# Patient Record
Sex: Female | Born: 1965 | Race: Black or African American | Hispanic: No | Marital: Married | Smoking: Never smoker
Health system: Southern US, Community
[De-identification: ages and names within clinical notes are randomized; demographics above are authoritative.]

## PROBLEM LIST (undated history)

## (undated) DIAGNOSIS — N189 Chronic kidney disease, unspecified: Secondary | ICD-10-CM

## (undated) DIAGNOSIS — D649 Anemia, unspecified: Secondary | ICD-10-CM

## (undated) DIAGNOSIS — N6001 Solitary cyst of right breast: Secondary | ICD-10-CM

## (undated) HISTORY — PX: TUBAL LIGATION: SHX77

## (undated) HISTORY — PX: CRYOTHERAPY: SHX1416

## (undated) HISTORY — DX: Solitary cyst of right breast: N60.01

---
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
## 2000-04-05 HISTORY — PX: BREAST BIOPSY: SHX20

## 2002-04-05 HISTORY — PX: BREAST MASS EXCISION: SHX1267

## 2005-09-10 ENCOUNTER — Emergency Department: Payer: Self-pay | Admitting: General Practice

## 2008-02-16 ENCOUNTER — Inpatient Hospital Stay: Payer: Self-pay

## 2010-09-01 ENCOUNTER — Ambulatory Visit: Payer: Self-pay

## 2013-12-06 ENCOUNTER — Encounter: Payer: Self-pay | Admitting: *Deleted

## 2013-12-19 ENCOUNTER — Encounter: Payer: Self-pay | Admitting: General Surgery

## 2013-12-19 ENCOUNTER — Ambulatory Visit (INDEPENDENT_AMBULATORY_CARE_PROVIDER_SITE_OTHER): Payer: Managed Care, Other (non HMO) | Admitting: General Surgery

## 2013-12-19 ENCOUNTER — Other Ambulatory Visit: Payer: Managed Care, Other (non HMO)

## 2013-12-19 VITALS — BP 130/88 | HR 86 | Resp 14 | Ht 66.0 in | Wt 184.0 lb

## 2013-12-19 DIAGNOSIS — N6320 Unspecified lump in the left breast, unspecified quadrant: Secondary | ICD-10-CM

## 2013-12-19 DIAGNOSIS — N63 Unspecified lump in unspecified breast: Secondary | ICD-10-CM

## 2013-12-19 NOTE — Progress Notes (Signed)
Patient ID: Belinda Gill, female   DOB: March 28, 1966, 48 y.o.   MRN: [id number]  Chief Complaint  Patient presents with  . Breast Problem    lump left breast    HPI Belinda Gill is a 48 y.o. female here today for a breast evaluation of a left breast mass. Patient had her last mammogram in July 2015. This was reported as normal. Patient felt a lump in her left breast three weeks ago. No pain noticed. Patient does perform self breast check and get regular mammogram. She has had prior benign lumps removed from both breasts. HPI  History reviewed. No pertinent past medical history.  Past Surgical History  Procedure Laterality Date  . Breast biopsy Right 1985    benign  . Breast mass excision Left 2004    fatty tumor  . Cesarean section  1987, 1993, 1997, 2009    History reviewed. No pertinent family history.  Social History History  Substance Use Topics  . Smoking status: Never Smoker   . Smokeless tobacco: Never Used  . Alcohol Use: Yes     Comment: ocassionally    No Known Allergies  No current outpatient prescriptions on file.   No current facility-administered medications for this visit.    Review of Systems Review of Systems  Blood pressure 130/88, pulse 86, resp. rate 14, height  (1.676 m), weight 184 lb (83.462 kg), last menstrual period 03/16/2013.  Physical Exam Physical Exam  Constitutional: She is oriented to person, place, and time. She appears well-developed and well-nourished.  Eyes: Conjunctivae are normal. No scleral icterus.  Neck: Neck supple. No mass and no thyromegaly present.  Pulmonary/Chest: Right breast exhibits no inverted nipple, no mass, no nipple discharge, no skin change and no tenderness.  Left breast mass 3 by 2 cm located 7 ocl. Feels like a ridge of thickened tissue near the inferior mammary fold.  Lymphadenopathy:    She has no cervical adenopathy.    She has no axillary adenopathy.  Neurological: She is alert and oriented  to person, place, and time.  Skin: Skin is dry.    Data Reviewed Left breast ultrasound was perform for palpable findings noticed-no abnormality seen Assessment    Likely fibrosis -recheck in three months. Discussed fully with pt.    Plan    Patient to return in 3 months left breast check.        Wah Sabic G 12/19/2013, 7:17 PM

## 2013-12-19 NOTE — Patient Instructions (Signed)
Patient to return in three months left breast check. Continue self breast exams. Call office for any new breast issues or concerns.

## 2014-02-04 ENCOUNTER — Encounter: Payer: Self-pay | Admitting: General Surgery

## 2014-03-12 ENCOUNTER — Ambulatory Visit (INDEPENDENT_AMBULATORY_CARE_PROVIDER_SITE_OTHER): Payer: Managed Care, Other (non HMO) | Admitting: General Surgery

## 2014-03-12 ENCOUNTER — Encounter: Payer: Self-pay | Admitting: General Surgery

## 2014-03-12 VITALS — BP 138/82 | HR 64 | Resp 14 | Ht 66.0 in | Wt 152.0 lb

## 2014-03-12 DIAGNOSIS — N63 Unspecified lump in breast: Secondary | ICD-10-CM

## 2014-03-12 DIAGNOSIS — N6320 Unspecified lump in the left breast, unspecified quadrant: Secondary | ICD-10-CM

## 2014-03-12 NOTE — Progress Notes (Signed)
Patient ID: Belinda Gill, female   DOB: Jul 23, 1965, 48 y.o.   MRN: [id number]  Chief Complaint  Patient presents with  . Follow-up    HPI Belinda Gill is a 48 y.o. female.  here today for a breast evaluation of a left breast mass. Patient had her last mammogram in July 2015. The patient had previously felt a lump in her left breast.  she states the area is not as large as before.  No pain noticed. Patient does perform self breast check and get regular mammogram. She has had prior benign lumps removed from both breasts.  HPI  History reviewed. No pertinent past medical history.  Past Surgical History  Procedure Laterality Date  . Breast biopsy Right 1985    benign  . Breast mass excision Left 2004    fatty tumor  . Cesarean section  1987, 1993, 1997, 2009    History reviewed. No pertinent family history.  Social History History  Substance Use Topics  . Smoking status: Never Smoker   . Smokeless tobacco: Never Used  . Alcohol Use: Yes     Comment: ocassionally    No Known Allergies  No current outpatient prescriptions on file.   No current facility-administered medications for this visit.    Review of Systems Review of Systems  Constitutional: Negative.   Respiratory: Negative.   Cardiovascular: Negative.   Neurological: Positive for headaches.    Blood pressure 138/82, pulse 64, resp. rate 14, height 5\' 6"  (1.676 m), weight 152 lb (68.947 kg), last menstrual period 03/12/2013.  Physical Exam Physical Exam  Constitutional: She is oriented to person, place, and time. She appears well-developed and well-nourished.  Eyes: Conjunctivae are normal. No scleral icterus.  Pulmonary/Chest: Left breast exhibits mass. Left breast exhibits no inverted nipple, no nipple discharge, no skin change and no tenderness.  2 cm left breast thickening at 7 ocl near inferior fold-much less prominent now.  Lymphadenopathy:    She has no axillary adenopathy.  Neurological: She is  alert and oriented to person, place, and time.  Skin: Skin is warm and dry.    Data Reviewed Previous notes and ultrasound.  Assessment    The left breast mass is smaller and less distinct and no noted lymph nodes.    Plan    Follow up July after her mamm[facility] at [facility].       SANKAR,SEEPLAPUTHUR G 03/14/2014, 7:48 AM

## 2014-03-12 NOTE — Patient Instructions (Addendum)
Continue self breast exams. Call office for any new breast issues or concerns. [facility] July [facility]

## 2014-03-14 ENCOUNTER — Encounter: Payer: Self-pay | Admitting: General Surgery

## 2014-10-31 ENCOUNTER — Ambulatory Visit: Payer: Managed Care, Other (non HMO) | Admitting: General Surgery

## 2014-12-10 ENCOUNTER — Encounter: Payer: Self-pay | Admitting: *Deleted

## 2014-12-25 LAB — HM PAP SMEAR: HM Pap smear: NEGATIVE

## 2014-12-25 LAB — HM MAMMOGRAPHY: HM Mammogram: NORMAL (ref 0–4)

## 2015-04-24 DIAGNOSIS — R109 Unspecified abdominal pain: Secondary | ICD-10-CM | POA: Insufficient documentation

## 2015-04-24 DIAGNOSIS — R10A1 Flank pain, right side: Secondary | ICD-10-CM | POA: Insufficient documentation

## 2015-05-08 DIAGNOSIS — I10 Essential (primary) hypertension: Secondary | ICD-10-CM | POA: Insufficient documentation

## 2015-05-08 DIAGNOSIS — N200 Calculus of kidney: Secondary | ICD-10-CM | POA: Insufficient documentation

## 2015-06-04 ENCOUNTER — Encounter: Payer: Self-pay | Admitting: General Surgery

## 2015-06-04 ENCOUNTER — Ambulatory Visit (INDEPENDENT_AMBULATORY_CARE_PROVIDER_SITE_OTHER): Payer: Managed Care, Other (non HMO) | Admitting: General Surgery

## 2015-06-04 VITALS — BP 124/88 | HR 80 | Resp 12 | Ht 66.0 in | Wt 150.0 lb

## 2015-06-04 DIAGNOSIS — K8020 Calculus of gallbladder without cholecystitis without obstruction: Secondary | ICD-10-CM | POA: Diagnosis not present

## 2015-06-04 NOTE — Progress Notes (Signed)
Patient ID: Belinda Gill, female   DOB: Mar 02, 1966, 50 y.o.   MRN: [id number]  Chief Complaint  Patient presents with  . Other    gall stones    HPI Belinda Gill is a 50 y.o. female.  Here today for evaluation of gallstones. She states she had right abdominal pain lasting about 2 hours in January 2017. Pain radiated to her back and was very uncomfortable. She has had 2 episodes of pain with vomiting. It woke her up from her sleep around 3 am and she could not get comfortable. She originally thought kidney stones but after the CT the kidney stones is on the left side and it showed multiple gall stones. I have reviewed the history of present illness with the patient.  HPI  History reviewed. No pertinent past medical history.  Past Surgical History  Procedure Laterality Date  . Breast biopsy Right 1985    benign  . Breast mass excision Left 2004    fatty tumor  . Cesarean section  1987, 1993, 1997, 2009    Family History  Problem Relation Age of Onset  . Heart failure Father   . Breast cancer Maternal Aunt   . Breast cancer Maternal Aunt     great aunt  . Breast cancer Maternal Aunt     great aunt    Social History Social History  Substance Use Topics  . Smoking status: Never Smoker   . Smokeless tobacco: Never Used  . Alcohol Use: Yes     Comment: ocassionally    No Known Allergies  Current Outpatient Prescriptions  Medication Sig Dispense Refill  . ibuprofen (ADVIL,MOTRIN) 800 MG tablet Take 800 mg by mouth every 8 (eight) hours as needed.      No current facility-administered medications for this visit.    Review of Systems Review of Systems  Constitutional: Negative.   Respiratory: Negative.   Cardiovascular: Negative.   Gastrointestinal: Positive for vomiting and abdominal pain. Negative for diarrhea and constipation.    Blood pressure 124/88, pulse 80, resp. rate 12, height  (1.676 m), weight 150 lb (68.04 kg), last menstrual period  03/12/2013.  Physical Exam Physical Exam  Constitutional: She is oriented to person, place, and time. She appears well-developed and well-nourished.  Eyes: Conjunctivae are normal. No scleral icterus.  Neck: Neck supple.  Cardiovascular: Normal rate, regular rhythm and normal heart sounds.   Pulmonary/Chest: Effort normal and breath sounds normal.  Abdominal: Soft. Normal appearance and bowel sounds are normal. There is no hepatomegaly. There is no tenderness. No hernia.  Lymphadenopathy:    She has no cervical adenopathy.  Neurological: She is alert and oriented to person, place, and time.  Skin: Skin is warm and dry.  Psychiatric: Her behavior is normal.    Data Reviewed  Ct scan showed gallstones, may be some GBW thickening Assessment      Biliary colic. Discussed findings and recommended cholecystectomy.     Plan    Laparoscopic Cholecystectomy with Intraoperative Cholangiogram. The procedure, including it's potential risks and complications (including but not limited to infection, bleeding, injury to intra-abdominal organs or bile ducts, bile leak, poor cosmetic result, sepsis and death) were discussed with the patient in detail. Non-operative options, including their inherent risks (acute calculous cholecystitis with possible choledocholithiasis or gallstone pancreatitis, with the risk of ascending cholangitis, sepsis, and death) were discussed as well. The patient expressed and understanding of what we discussed and wishes to proceed with laparoscopic cholecystectomy. The patient further  understands that if it is technically not possible, or it is unsafe to proceed laparoscopically, that I will convert to an open cholecystectomy.  Patient's surgery has been scheduled for 06-09-15 at [facility].     PCP:  No Pcp  Ref: Lisabeth Pick NP (Dr.Christine Kistler) [facility]    This information has been scribed by Dorathy Daft RNBC.   Belinda Gill 06/04/2015, 12:21  PM

## 2015-06-04 NOTE — Patient Instructions (Addendum)
Laparoscopic Cholecystectomy Laparoscopic cholecystectomy is surgery to remove the gallbladder. The gallbladder is located in the upper right part of the abdomen, behind the liver. It is a storage sac for bile, which is produced in the liver. Bile aids in the digestion and absorption of fats. Cholecystectomy is often done for inflammation of the gallbladder (cholecystitis). This condition is usually caused by a buildup of gallstones (cholelithiasis) in the gallbladder. Gallstones can block the flow of bile, and that can result in inflammation and pain. In severe cases, emergency surgery may be required. If emergency surgery is not required, you will have time to prepare for the procedure. Laparoscopic surgery is an alternative to open surgery. Laparoscopic surgery has a shorter recovery time. Your common bile duct may also need to be examined during the procedure. If stones are found in the common bile duct, they may be removed. LET [facility] CARE PROVIDER KNOW ABOUT:  Any allergies you have.  All medicines you are taking, including vitamins, herbs, eye drops, creams, and over-the-counter medicines.  Previous problems you or members of your family have had with the use of anesthetics.  Any blood disorders you have.  Previous surgeries you have had.  Any medical conditions you have. RISKS AND COMPLICATIONS Generally, this is a safe procedure. However, problems may occur, including:  Infection.  Bleeding.  Allergic reactions to medicines.  Damage to other structures or organs.  A stone remaining in the common bile duct.  A bile leak from the cyst duct that is clipped when your gallbladder is removed.  The need to convert to open surgery, which requires a larger incision in the abdomen. This may be necessary if your surgeon thinks that it is not safe to continue with a laparoscopic procedure. BEFORE THE PROCEDURE  Ask your health care provider about:  Changing or stopping your  regular medicines. This is especially important if you are taking diabetes medicines or blood thinners.  Taking medicines such as aspirin and ibuprofen. These medicines can thin your blood. Do not take these medicines before your procedure if your health care provider instructs you not to.  Follow instructions from your health care provider about eating or drinking restrictions.  Let your health care provider know if you develop a cold or an infection before surgery.  Plan to have someone take you home after the procedure.  Ask your health care provider how your surgical site will be marked or identified.  You may be given antibiotic medicine to help prevent infection. PROCEDURE  To reduce your risk of infection:  Your health care team will wash or sanitize their hands.  Your skin will be washed with soap.  An IV tube may be inserted into one of your veins.  You will be given a medicine to make you fall asleep (general anesthetic).  A breathing tube will be placed in your mouth.  The surgeon will make several small cuts (incisions) in your abdomen.  A thin, lighted tube (laparoscope) that has a tiny camera on the end will be inserted through one of the small incisions. The camera on the laparoscope will send a picture to a TV screen (monitor) in the operating room. This will give the surgeon a good view inside your abdomen.  A gas will be pumped into your abdomen. This will expand your abdomen to give the surgeon more room to perform the surgery.  Other tools that are needed for the procedure will be inserted through the other incisions. The gallbladder  will be removed through one of the incisions.  After your gallbladder has been removed, the incisions will be closed with stitches (sutures), staples, or skin glue.  Your incisions may be covered with a bandage (dressing). The procedure may vary among health care providers and hospitals. AFTER THE PROCEDURE  Your blood  pressure, heart rate, breathing rate, and blood oxygen level will be monitored often until the medicines you were given have worn off.  You will be given medicines as needed to control your pain.   This information is not intended to replace advice given to you by your health care provider. Make sure you discuss any questions you have with your health care provider.   Document Released: 03/22/2005 Document Revised: 12/11/2014 Document Reviewed: 11/01/2012 Elsevier Interactive Patient Education 2016 [employer] Laparoscopic Cholecystectomy Laparoscopic cholecystectomy is surgery to remove the gallbladder. The gallbladder is located in the upper right part of the abdomen, behind the liver. It is a storage sac for bile, which is produced in the liver. Bile aids in the digestion and absorption of fats. Cholecystectomy is often done for inflammation of the gallbladder (cholecystitis). This condition is usually caused by a buildup of gallstones (cholelithiasis) in the gallbladder. Gallstones can block the flow of bile, and that can result in inflammation and pain. In severe cases, emergency surgery may be required. If emergency surgery is not required, you will have time to prepare for the procedure. Laparoscopic surgery is an alternative to open surgery. Laparoscopic surgery has a shorter recovery time. Your common bile duct may also need to be examined during the procedure. If stones are found in the common bile duct, they may be removed. LET [facility] CARE PROVIDER KNOW ABOUT:  Any allergies you have.  All medicines you are taking, including vitamins, herbs, eye drops, creams, and over-the-counter medicines.  Previous problems you or members of your family have had with the use of anesthetics.  Any blood disorders you have.  Previous surgeries you have had.  Any medical conditions you have. RISKS AND COMPLICATIONS Generally, this is a safe procedure. However, problems may occur,  including:  Infection.  Bleeding.  Allergic reactions to medicines.  Damage to other structures or organs.  A stone remaining in the common bile duct.  A bile leak from the cyst duct that is clipped when your gallbladder is removed.  The need to convert to open surgery, which requires a larger incision in the abdomen. This may be necessary if your surgeon thinks that it is not safe to continue with a laparoscopic procedure. BEFORE THE PROCEDURE  Ask your health care provider about:  Changing or stopping your regular medicines. This is especially important if you are taking diabetes medicines or blood thinners.  Taking medicines such as aspirin and ibuprofen. These medicines can thin your blood. Do not take these medicines before your procedure if your health care provider instructs you not to.  Follow instructions from your health care provider about eating or drinking restrictions.  Let your health care provider know if you develop a cold or an infection before surgery.  Plan to have someone take you home after the procedure.  Ask your health care provider how your surgical site will be marked or identified.  You may be given antibiotic medicine to help prevent infection. PROCEDURE  To reduce your risk of infection:  Your health care team will wash or sanitize their hands.  Your skin will be washed with soap.  An IV tube may be  inserted into one of your veins.  You will be given a medicine to make you fall asleep (general anesthetic).  A breathing tube will be placed in your mouth.  The surgeon will make several small cuts (incisions) in your abdomen.  A thin, lighted tube (laparoscope) that has a tiny camera on the end will be inserted through one of the small incisions. The camera on the laparoscope will send a picture to a TV screen (monitor) in the operating room. This will give the surgeon a good view inside your abdomen.  A gas will be pumped into your  abdomen. This will expand your abdomen to give the surgeon more room to perform the surgery.  Other tools that are needed for the procedure will be inserted through the other incisions. The gallbladder will be removed through one of the incisions.  After your gallbladder has been removed, the incisions will be closed with stitches (sutures), staples, or skin glue.  Your incisions may be covered with a bandage (dressing). The procedure may vary among health care providers and hospitals. AFTER THE PROCEDURE  Your blood pressure, heart rate, breathing rate, and blood oxygen level will be monitored often until the medicines you were given have worn off.  You will be given medicines as needed to control your pain.   This information is not intended to replace advice given to you by your health care provider. Make sure you discuss any questions you have with your health care provider.   Document Released: 03/22/2005 Document Revised: 12/11/2014 Document Reviewed: 11/01/2012 Elsevier Interactive Patient Education 2016 [employer].  Patient's surgery has been scheduled for 06-09-15 at [facility].

## 2015-06-05 ENCOUNTER — Telehealth: Payer: Self-pay | Admitting: *Deleted

## 2015-06-05 LAB — HEPATIC FUNCTION PANEL
ALBUMIN: 4.2 g/dL (ref 3.5–5.5)
ALT: 24 IU/L (ref 0–32)
AST: 21 IU/L (ref 0–40)
Alkaline Phosphatase: 75 IU/L (ref 39–117)
Bilirubin Total: 0.6 mg/dL (ref 0.0–1.2)
Bilirubin, Direct: 0.14 mg/dL (ref 0.00–0.40)
Total Protein: 7 g/dL (ref 6.0–8.5)

## 2015-06-05 LAB — CBC WITH DIFFERENTIAL/PLATELET
Basophils Absolute: 0 10*3/uL (ref 0.0–0.2)
Basos: 0 %
EOS (ABSOLUTE): 0 10*3/uL (ref 0.0–0.4)
EOS: 1 %
HEMATOCRIT: 38 % (ref 34.0–46.6)
Hemoglobin: 12.9 g/dL (ref 11.1–15.9)
Immature Grans (Abs): 0 10*3/uL (ref 0.0–0.1)
Immature Granulocytes: 0 %
Lymphocytes Absolute: 1.9 10*3/uL (ref 0.7–3.1)
Lymphs: 34 %
MCH: 26.9 pg (ref 26.6–33.0)
MCHC: 33.9 g/dL (ref 31.5–35.7)
MCV: 79 fL (ref 79–97)
MONOS ABS: 0.3 10*3/uL (ref 0.1–0.9)
Monocytes: 6 %
Neutrophils Absolute: 3.3 10*3/uL (ref 1.4–7.0)
Neutrophils: 59 %
Platelets: 202 10*3/uL (ref 150–379)
RBC: 4.8 x10E6/uL (ref 3.77–5.28)
RDW: 14.6 % (ref 12.3–15.4)
WBC: 5.5 10*3/uL (ref 3.4–10.8)

## 2015-06-05 NOTE — Progress Notes (Signed)
Quick Note:  Inform pt labs are normal. F/u as scheduled ______ 

## 2015-06-05 NOTE — Telephone Encounter (Signed)
Notified patient as instructed, patient pleased. Discussed follow-up appointments surgery Monday, patient agrees

## 2015-06-05 NOTE — Telephone Encounter (Signed)
-----   Message from Kieth Brightly, MD sent at 06/05/2015  8:46 AM EST ----- Inform pt labs are normal. F/u as scheduled

## 2015-06-06 ENCOUNTER — Encounter: Payer: Self-pay | Admitting: *Deleted

## 2015-06-06 ENCOUNTER — Other Ambulatory Visit: Payer: Self-pay

## 2015-06-06 NOTE — Patient Instructions (Signed)
  Your procedure is scheduled on: 06-09-15 Report to [facility] 2ND FLOOR. To find out your arrival time please call [phone] between 1PM - 3PM on 06-06-15  Remember: Instructions that are not followed completely may result in serious medical risk, up to and including death, or upon the discretion of your surgeon and anesthesiologist your surgery may need to be rescheduled.    _X___ 1. Do not eat food or drink liquids after midnight. No gum chewing or hard candies.     _X___ 2. No Alcohol for 24 hours before or after surgery.   ____ 3. Bring all medications with you on the day of surgery if instructed.    ____ 4. Notify your doctor if there is any change in your medical condition     (cold, fever, infections).     Do not wear jewelry, make-up, hairpins, clips or nail polish.  Do not wear lotions, powders, or perfumes. You may wear deodorant.  Do not shave 48 hours prior to surgery. Men may shave face and neck.  Do not bring valuables to the hospital.    [facility] is not responsible for any belongings or valuables.               Contacts, dentures or bridgework may not be worn into surgery.  Leave your suitcase in the car. After surgery it may be brought to your room.  For patients admitted to the hospital, discharge time is determined by your treatment team.   Patients discharged the day of surgery will not be allowed to drive home.   Please read over the following fact sheets that you were given:      ____ Take these medicines the morning of surgery with A SIP OF WATER:    1. NONE  2.   3.   4.  5.  6.  ____ Fleet Enema (as directed)   ____ Use CHG Soap as directed  ____ Use inhalers on the day of surgery  ____ Stop metformin 2 days prior to surgery    ____ Take 1/2 of usual insulin dose the night before surgery and none on the morning of surgery.   ____ Stop Coumadin/Plavix/aspirin-N/A  _X___ Stop Anti-inflammatories-PT HAS ALREADY STOPPED  IBUPROFEN-NO NSAIDS OR ASA PRODUCTS-TYLENOL OK TO TAKE   ____ Stop supplements until after surgery.    ____ Bring C-Pap to the hospital.

## 2015-06-09 ENCOUNTER — Ambulatory Visit
Admission: RE | Admit: 2015-06-09 | Discharge: 2015-06-09 | Disposition: A | Discharge status: Home / Self Care | Payer: Managed Care, Other (non HMO) | Source: Ambulatory Visit | Attending: General Surgery | Admitting: General Surgery

## 2015-06-09 ENCOUNTER — Ambulatory Visit: Payer: Managed Care, Other (non HMO) | Admitting: Anesthesiology

## 2015-06-09 ENCOUNTER — Ambulatory Visit: Payer: Managed Care, Other (non HMO)

## 2015-06-09 ENCOUNTER — Encounter
Admission: RE | Disposition: A | Discharge status: Home / Self Care | Payer: Self-pay | Source: Ambulatory Visit | Attending: General Surgery

## 2015-06-09 ENCOUNTER — Encounter: Payer: Self-pay | Admitting: *Deleted

## 2015-06-09 DIAGNOSIS — K8010 Calculus of gallbladder with chronic cholecystitis without obstruction: Secondary | ICD-10-CM | POA: Diagnosis not present

## 2015-06-09 DIAGNOSIS — Z803 Family history of malignant neoplasm of breast: Secondary | ICD-10-CM | POA: Diagnosis not present

## 2015-06-09 DIAGNOSIS — Z8249 Family history of ischemic heart disease and other diseases of the circulatory system: Secondary | ICD-10-CM | POA: Diagnosis not present

## 2015-06-09 DIAGNOSIS — Z9889 Other specified postprocedural states: Secondary | ICD-10-CM | POA: Insufficient documentation

## 2015-06-09 DIAGNOSIS — Z791 Long term (current) use of non-steroidal anti-inflammatories (NSAID): Secondary | ICD-10-CM | POA: Insufficient documentation

## 2015-06-09 DIAGNOSIS — K8020 Calculus of gallbladder without cholecystitis without obstruction: Secondary | ICD-10-CM

## 2015-06-09 HISTORY — DX: Chronic kidney disease, unspecified: N18.9

## 2015-06-09 HISTORY — DX: Anemia, unspecified: D64.9

## 2015-06-09 HISTORY — PX: CHOLECYSTECTOMY: SHX55

## 2015-06-09 SURGERY — LAPAROSCOPIC CHOLECYSTECTOMY WITH INTRAOPERATIVE CHOLANGIOGRAM
Anesthesia: General | Wound class: Clean Contaminated

## 2015-06-09 MED ORDER — LIDOCAINE HCL (CARDIAC) 20 MG/ML IV SOLN
INTRAVENOUS | Status: DC | PRN
  Administered 2015-06-09: 100 mg via INTRAVENOUS

## 2015-06-09 MED ORDER — OXYCODONE-ACETAMINOPHEN 5-325 MG PO TABS
1.0000 | ORAL_TABLET | ORAL | Status: DC | PRN
  Administered 2015-06-09: 1 via ORAL

## 2015-06-09 MED ORDER — ACETAMINOPHEN 10 MG/ML IV SOLN
INTRAVENOUS | Status: AC
  Filled 2015-06-09: qty 100

## 2015-06-09 MED ORDER — ONDANSETRON HCL 4 MG/2ML IJ SOLN
INTRAMUSCULAR | Status: DC | PRN
  Administered 2015-06-09: 4 mg via INTRAVENOUS

## 2015-06-09 MED ORDER — FAMOTIDINE 20 MG PO TABS
20.0000 mg | ORAL_TABLET | Freq: Once | ORAL | Status: AC
  Administered 2015-06-09: 20 mg via ORAL

## 2015-06-09 MED ORDER — ESMOLOL HCL 100 MG/10ML IV SOLN
INTRAVENOUS | Status: DC | PRN
  Administered 2015-06-09: 10 mg via INTRAVENOUS
  Administered 2015-06-09: 20 mg via INTRAVENOUS

## 2015-06-09 MED ORDER — PROPOFOL 10 MG/ML IV BOLUS
INTRAVENOUS | Status: DC | PRN
  Administered 2015-06-09: 150 mg via INTRAVENOUS

## 2015-06-09 MED ORDER — SUCCINYLCHOLINE CHLORIDE 20 MG/ML IJ SOLN
INTRAMUSCULAR | Status: DC | PRN
  Administered 2015-06-09: 100 mg via INTRAVENOUS

## 2015-06-09 MED ORDER — FENTANYL CITRATE (PF) 100 MCG/2ML IJ SOLN
INTRAMUSCULAR | Status: DC | PRN
  Administered 2015-06-09 (×2): 100 ug via INTRAVENOUS

## 2015-06-09 MED ORDER — CEFAZOLIN SODIUM-DEXTROSE 2-3 GM-% IV SOLR
INTRAVENOUS | Status: AC
  Administered 2015-06-09: 2 g via INTRAVENOUS
  Filled 2015-06-09: qty 50

## 2015-06-09 MED ORDER — FAMOTIDINE 20 MG PO TABS
ORAL_TABLET | ORAL | Status: AC
  Administered 2015-06-09: 20 mg via ORAL
  Filled 2015-06-09: qty 1

## 2015-06-09 MED ORDER — OXYCODONE-ACETAMINOPHEN 5-325 MG PO TABS: 1.0000 | ORAL_TABLET | ORAL | Status: DC | PRN

## 2015-06-09 MED ORDER — LACTATED RINGERS IR SOLN
Status: DC | PRN
Start: 2015-06-09 — End: 2015-06-09
  Administered 2015-06-09: 50 mL

## 2015-06-09 MED ORDER — CEFAZOLIN SODIUM-DEXTROSE 2-3 GM-% IV SOLR
2.0000 g | INTRAVENOUS | Status: AC
  Administered 2015-06-09: 2 g via INTRAVENOUS

## 2015-06-09 MED ORDER — OXYCODONE-ACETAMINOPHEN 5-325 MG PO TABS
ORAL_TABLET | ORAL | Status: AC
  Filled 2015-06-09: qty 1

## 2015-06-09 MED ORDER — ACETAMINOPHEN 10 MG/ML IV SOLN
INTRAVENOUS | Status: DC | PRN
  Administered 2015-06-09: 1000 mg via INTRAVENOUS

## 2015-06-09 MED ORDER — LACTATED RINGERS IV SOLN
INTRAVENOUS | Status: DC
  Administered 2015-06-09 (×3): via INTRAVENOUS

## 2015-06-09 MED ORDER — SUGAMMADEX SODIUM 200 MG/2ML IV SOLN
INTRAVENOUS | Status: DC | PRN
  Administered 2015-06-09: 136 mg via INTRAVENOUS

## 2015-06-09 MED ORDER — ONDANSETRON HCL 4 MG/2ML IJ SOLN: 4.0000 mg | Freq: Once | INTRAMUSCULAR | Status: DC | PRN

## 2015-06-09 MED ORDER — CHLORHEXIDINE GLUCONATE 4 % EX LIQD: 1.0000 "application " | Freq: Once | CUTANEOUS | Status: DC

## 2015-06-09 MED ORDER — SODIUM CHLORIDE 0.9 % IV SOLN
INTRAVENOUS | Status: DC | PRN
  Administered 2015-06-09: 11 mL

## 2015-06-09 MED ORDER — DEXAMETHASONE SODIUM PHOSPHATE 4 MG/ML IJ SOLN
INTRAMUSCULAR | Status: DC | PRN
  Administered 2015-06-09: 10 mg via INTRAVENOUS

## 2015-06-09 MED ORDER — FENTANYL CITRATE (PF) 100 MCG/2ML IJ SOLN: 25.0000 ug | INTRAMUSCULAR | Status: DC | PRN

## 2015-06-09 MED ORDER — SODIUM CHLORIDE 0.9 % IJ SOLN
INTRAMUSCULAR | Status: AC
  Filled 2015-06-09: qty 50

## 2015-06-09 MED ORDER — ROCURONIUM BROMIDE 100 MG/10ML IV SOLN
INTRAVENOUS | Status: DC | PRN
  Administered 2015-06-09: 25 mg via INTRAVENOUS
  Administered 2015-06-09: 5 mg via INTRAVENOUS

## 2015-06-09 MED ORDER — MIDAZOLAM HCL 2 MG/2ML IJ SOLN
INTRAMUSCULAR | Status: DC | PRN
  Administered 2015-06-09: 2 mg via INTRAVENOUS

## 2015-06-09 SURGICAL SUPPLY — 38 items
ANCHOR TIS RET SYS 235ML (MISCELLANEOUS) ×2 IMPLANT
APPLICATOR SURGIFLO (MISCELLANEOUS) IMPLANT
APPLIER CLIP LOGIC TI 5 (MISCELLANEOUS) ×2 IMPLANT
BENZOIN TINCTURE PRP APPL 2/3 (GAUZE/BANDAGES/DRESSINGS) ×2 IMPLANT
BLADE SURG 11 STRL SS SAFETY (MISCELLANEOUS) ×2 IMPLANT
CANISTER SUCT 1200ML W/VALVE (MISCELLANEOUS) ×2 IMPLANT
CANNULA DILATOR 10 W/SLV (CANNULA) ×2 IMPLANT
CATH CHOLANG 76X19 KUMAR (CATHETERS) ×2 IMPLANT
CHLORAPREP W/TINT 26ML (MISCELLANEOUS) ×2 IMPLANT
DEFOGGER SCOPE WARMER CLEARIFY (MISCELLANEOUS) ×2 IMPLANT
DRAPE C-ARM XRAY 36X54 (DRAPES) ×2 IMPLANT
DRAPE INCISE IOBAN 66X45 STRL (DRAPES) ×2 IMPLANT
DRESSING TELFA 4X3 1S ST N-ADH (GAUZE/BANDAGES/DRESSINGS) ×2 IMPLANT
DRSG TEGADERM 2-3/8X2-3/4 SM (GAUZE/BANDAGES/DRESSINGS) ×2 IMPLANT
ELECT REM PT RETURN 9FT ADLT (ELECTROSURGICAL) ×2
ELECTRODE REM PT RTRN 9FT ADLT (ELECTROSURGICAL) ×1 IMPLANT
GLOVE BIO SURGEON STRL SZ7 (GLOVE) ×12 IMPLANT
GOWN STRL REUS W/ TWL LRG LVL3 (GOWN DISPOSABLE) ×3 IMPLANT
GOWN STRL REUS W/TWL LRG LVL3 (GOWN DISPOSABLE) ×3
GRASPER SUT TROCAR 14GX15 (MISCELLANEOUS) ×2 IMPLANT
HEMOSTAT SURGICEL 2X3 (HEMOSTASIS) IMPLANT
IRRIGATION STRYKERFLOW (MISCELLANEOUS) ×1 IMPLANT
IRRIGATOR STRYKERFLOW (MISCELLANEOUS) ×2
IV LACTATED RINGERS 1000ML (IV SOLUTION) ×2 IMPLANT
KIT RM TURNOVER STRD PROC AR (KITS) ×2 IMPLANT
LABEL OR SOLS (LABEL) ×2 IMPLANT
NDL INSUFF ACCESS 14 VERSASTEP (NEEDLE) ×2 IMPLANT
PACK LAP CHOLECYSTECTOMY (MISCELLANEOUS) ×2 IMPLANT
SCISSORS METZENBAUM CVD 33 (INSTRUMENTS) ×2 IMPLANT
SLEEVE ENDOPATH XCEL 5M (ENDOMECHANICALS) ×4 IMPLANT
SPOGE SURGIFLO 8M (HEMOSTASIS)
SPONGE SURGIFLO 8M (HEMOSTASIS) IMPLANT
STRIP CLOSURE SKIN 1/2X4 (GAUZE/BANDAGES/DRESSINGS) ×2 IMPLANT
SUT VIC AB 0 SH 27 (SUTURE) ×4 IMPLANT
SUT VIC AB 4-0 FS2 27 (SUTURE) ×4 IMPLANT
SWABSTK COMLB BENZOIN TINCTURE (MISCELLANEOUS) ×2 IMPLANT
TROCAR XCEL NON-BLD 5MMX100MML (ENDOMECHANICALS) ×2 IMPLANT
TUBING INSUFFLATOR HI FLOW (MISCELLANEOUS) ×2 IMPLANT

## 2015-06-09 NOTE — H&P (View-Only) (Signed)
Patient ID: Belinda Gill, female   DOB: April 24, 1965, [age] y.o.   MRN: [id number]  Chief Complaint  Patient presents with  . Other    gall stones    HPI Belinda Gill is a 50 y.o. female.  Here today for evaluation of gallstones. She states she had right abdominal pain lasting about 2 hours in January 2017. Pain radiated to her back and was very uncomfortable. She has had 2 episodes of pain with vomiting. It woke her up from her sleep around 3 am and she could not get comfortable. She originally thought kidney stones but after the CT the kidney stones is on the left side and it showed multiple gall stones. I have reviewed the history of present illness with the patient.  HPI  History reviewed. No pertinent past medical history.  Past Surgical History  Procedure Laterality Date  . Breast biopsy Right 1985    benign  . Breast mass excision Left 2004    fatty tumor  . Cesarean section  1987, 1993, 1997, 2009    Family History  Problem Relation Age of Onset  . Heart failure Father   . Breast cancer Maternal Aunt   . Breast cancer Maternal Aunt     great aunt  . Breast cancer Maternal Aunt     great aunt    Social History Social History  Substance Use Topics  . Smoking status: Never Smoker   . Smokeless tobacco: Never Used  . Alcohol Use: Yes     Comment: ocassionally    No Known Allergies  Current Outpatient Prescriptions  Medication Sig Dispense Refill  . ibuprofen (ADVIL,MOTRIN) 800 MG tablet Take 800 mg by mouth every 8 (eight) hours as needed.      No current facility-administered medications for this visit.    Review of Systems Review of Systems  Constitutional: Negative.   Respiratory: Negative.   Cardiovascular: Negative.   Gastrointestinal: Positive for vomiting and abdominal pain. Negative for diarrhea and constipation.    Blood pressure 124/88, pulse 80, resp. rate 12, height 5\' 6"  (1.676 m), weight 150 lb (68.04 kg), last menstrual period  03/12/2013.  Physical Exam Physical Exam  Constitutional: She is oriented to person, place, and time. She appears well-developed and well-nourished.  Eyes: Conjunctivae are normal. No scleral icterus.  Neck: Neck supple.  Cardiovascular: Normal rate, regular rhythm and normal heart sounds.   Pulmonary/Chest: Effort normal and breath sounds normal.  Abdominal: Soft. Normal appearance and bowel sounds are normal. There is no hepatomegaly. There is no tenderness. No hernia.  Lymphadenopathy:    She has no cervical adenopathy.  Neurological: She is alert and oriented to person, place, and time.  Skin: Skin is warm and dry.  Psychiatric: Her behavior is normal.    Data Reviewed  Ct scan showed gallstones, may be some GBW thickening Assessment      Biliary colic. Discussed findings and recommended cholecystectomy.     Plan    Laparoscopic Cholecystectomy with Intraoperative Cholangiogram. The procedure, including it's potential risks and complications (including but not limited to infection, bleeding, injury to intra-abdominal organs or bile ducts, bile leak, poor cosmetic result, sepsis and death) were discussed with the patient in detail. Non-operative options, including their inherent risks (acute calculous cholecystitis with possible choledocholithiasis or gallstone pancreatitis, with the risk of ascending cholangitis, sepsis, and death) were discussed as well. The patient expressed and understanding of what we discussed and wishes to proceed with laparoscopic cholecystectomy. The patient further  understands that if it is technically not possible, or it is unsafe to proceed laparoscopically, that I will convert to an open cholecystectomy.  Patient's surgery has been scheduled for 06-09-15 at [facility].     PCP:  No Pcp  Ref: Lisabeth Pick NP (Dr.Christine Kistler) [facility]    This information has been scribed by Dorathy Daft RNBC.   Oliverio Cho G 06/04/2015, 12:21  PM

## 2015-06-09 NOTE — Anesthesia Preprocedure Evaluation (Signed)
Anesthesia Evaluation  Patient identified by MRN, date of birth, ID band Patient awake    Reviewed: Allergy & Precautions, H&P , NPO status , Patient's Chart, lab work & pertinent test results, reviewed documented beta blocker date and time   Airway Mallampati: II  TM Distance: >3 FB Neck ROM: full    Dental  (+) Teeth Intact   Pulmonary neg pulmonary ROS,    Pulmonary exam normal        Cardiovascular negative cardio ROS Normal cardiovascular exam Rhythm:regular Rate:Normal     Neuro/Psych negative neurological ROS  negative psych ROS   GI/Hepatic negative GI ROS, Neg liver ROS,   Endo/Other  negative endocrine ROS  Renal/GU Renal diseasenegative Renal ROS  negative genitourinary   Musculoskeletal   Abdominal   Peds  Hematology negative hematology ROS (+) anemia ,   Anesthesia Other Findings Past Medical History:   Anemia                                                         Comment:H/O   Chronic kidney disease                                         Comment:KIDNEY STONES (LEFT) 06-06-15 Past Surgical History:   BREAST BIOPSY                                   Right 1985           Comment:benign   BREAST MASS EXCISION                            Left 2004           Comment:fatty tumor   CESAREAN SECTION                                 1987, 199*   TUBAL LIGATION                                                Reproductive/Obstetrics negative OB ROS                             Anesthesia Physical Anesthesia Plan  ASA: II  Anesthesia Plan: General ETT   Post-op Pain Management:    Induction:   Airway Management Planned:   Additional Equipment:   Intra-op Plan:   Post-operative Plan:   Informed Consent: I have reviewed the patients History and Physical, chart, labs and discussed the procedure including the risks, benefits and alternatives for the proposed anesthesia with  the patient or authorized representative who has indicated his/her understanding and acceptance.   Dental Advisory Given  Plan Discussed with: CRNA  Anesthesia Plan Comments:         Anesthesia Quick Evaluation

## 2015-06-09 NOTE — Discharge Instructions (Signed)

## 2015-06-09 NOTE — Transfer of Care (Signed)
Immediate Anesthesia Transfer of Care Note  Patient: Belinda Gill  Procedure(s) Performed: Procedure(s): LAPAROSCOPIC CHOLECYSTECTOMY WITH INTRAOPERATIVE CHOLANGIOGRAM (N/A)  Patient Location: PACU  Anesthesia Type:General  Level of Consciousness: awake, alert  and oriented  Airway & Oxygen Therapy: Patient Spontanous Breathing and Patient connected to nasal cannula oxygen  Post-op Assessment: Report given to RN and Post -op Vital signs reviewed and stable  Post vital signs: Reviewed and stable  Last Vitals:  Filed Vitals:   06/09/15 0611 06/09/15 0834  BP: 149/87 140/93  Pulse: 76 96  Temp: 36.7 C 36.2 C  Resp: 16 18    Complications: No apparent anesthesia complications

## 2015-06-09 NOTE — Op Note (Signed)
Preop diagnosis: Cholelithiasis with chronic cholecystitis  Post op diagnosis: Same  Operation: Laparoscopy cholecystectomy and intraoperative cholangiogram  Surgeon: S.G.Sankar  Assistant:     Anesthesia: Gen.  Complications: None  EBL: Minimal  Drains: None  Description: Patient was put to sleep in supine position the operating table. Abdomen was prepped and draped sterile field and timeout performed. Port incision was made at the inferior lip of the umbilicus and a Veress needle with the InnerDyne sleeve was position in the peritoneal cavity and verified of the hanging drop method. Pneumoperitoneum was obtained and a 10 mm port was placed. Camera was introduced with good visualization the peritoneal cavity and there was no apparent injury to the underlying structures from initial entry. Epigastric and 2 lateral 5 mm ports were placed. The gallbladder had a few adhesions to the omentum and tenting up the duodenum also with a flimsy adhesion. Gallbladder wall was moderately thickened but no acute changes were noted. With careful cephalad traction the adhesions were taken down until the Hartman's pouch was exposed. Further dissection was continued to isolate the cystic duct and the cystic artery and the junction with the common bile duct. Kumar clamp and catheter were then positioned and cholangiogram was completed. There was good filling of the bile duct in the proximal hepatic duct with no apparent filling defects and no obstruction to flow. The catheter was used to decompress the gallbladder and then removed. Cystic duct and the artery was hemoclipped and cut. The posterior branch was also identified which was separately hemoclipped and cut. Gallbladder was dissected free from its bed using cautery for control of bleeding. With a 5 mm port in the epigastric port site the gallbladder was brought out through the umbilical port side with a retrieval bag. It was noted contain multiple faceted stones  up to about a centimeter in size max. The right upper quadrant area was irrigated with a small amount of fluid and this was all suctioned out. Ports removed in the fascial opening of the umbilicus closed with the figure-of-eight 0 Vicryl. All skin incisions were closed with subcuticular 4-0 Vicryl reinforced with Steri-Strips and tincture benzoin. Telfa and Tegaderm dressings were placed. Patient subsequently returned recovery room stable condition.

## 2015-06-09 NOTE — Interval H&P Note (Signed)
History and Physical Interval Note:  06/09/2015 7:11 AM  Belinda Gill  has presented today for surgery, with the diagnosis of CHOLILITHIASIS,CHRONIC CHOLECYSTITIS  The various methods of treatment have been discussed with the patient and family. After consideration of risks, benefits and other options for treatment, the patient has consented to  Procedure(s): LAPAROSCOPIC CHOLECYSTECTOMY WITH INTRAOPERATIVE CHOLANGIOGRAM (N/A) as a surgical intervention .  The patient's history has been reviewed, patient examined, no change in status, stable for surgery.  I have reviewed the patient's chart and labs.  Questions were answered to the patient's satisfaction.     Vercie Pokorny G

## 2015-06-09 NOTE — Anesthesia Procedure Notes (Signed)
Procedure Name: Intubation Date/Time: 06/09/2015 7:30 AM Performed by: Paulette BlanchPARAS, Mercedez Boule Pre-anesthesia Checklist: Patient identified, Patient being monitored, Timeout performed, Emergency Drugs available and Suction available Patient Re-evaluated:Patient Re-evaluated prior to inductionOxygen Delivery Method: Circle system utilized Preoxygenation: Pre-oxygenation with 100% oxygen Intubation Type: IV induction Ventilation: Mask ventilation without difficulty Laryngoscope Size: 3 and Miller Grade View: Grade I Tube type: Oral Tube size: 7.5 mm Number of attempts: 1 Placement Confirmation: ETT inserted through vocal cords under direct vision,  positive ETCO2 and breath sounds checked- equal and bilateral Secured at: 21 cm Tube secured with: Tape Dental Injury: Teeth and Oropharynx as per pre-operative assessment

## 2015-06-10 LAB — SURGICAL PATHOLOGY

## 2015-06-10 NOTE — Anesthesia Postprocedure Evaluation (Signed)
Anesthesia Post Note  Patient: Elmon Elsengela M Foister  Procedure(s) Performed: Procedure(s) (LRB): LAPAROSCOPIC CHOLECYSTECTOMY WITH INTRAOPERATIVE CHOLANGIOGRAM (N/A)  Patient location during evaluation: PACU Anesthesia Type: General Level of consciousness: awake and alert Pain management: pain level controlled Vital Signs Assessment: post-procedure vital signs reviewed and stable Respiratory status: spontaneous breathing, nonlabored ventilation, respiratory function stable and patient connected to nasal cannula oxygen Cardiovascular status: blood pressure returned to baseline and stable Postop Assessment: no signs of nausea or vomiting Anesthetic complications: no    Last Vitals:  Filed Vitals:   06/09/15 0953 06/09/15 1012  BP: 105/62 106/67  Pulse: 64 72  Temp:    Resp: 14 14    Last Pain:  Filed Vitals:   06/10/15 0817  PainSc: 0-No pain                 Yevette EdwardsJames G Delena Casebeer

## 2015-06-16 ENCOUNTER — Encounter: Payer: Self-pay | Admitting: General Surgery

## 2015-06-16 ENCOUNTER — Ambulatory Visit (INDEPENDENT_AMBULATORY_CARE_PROVIDER_SITE_OTHER): Payer: Managed Care, Other (non HMO) | Admitting: General Surgery

## 2015-06-16 VITALS — BP 120/78 | HR 78 | Resp 14 | Ht 66.0 in | Wt 147.0 lb

## 2015-06-16 DIAGNOSIS — K8000 Calculus of gallbladder with acute cholecystitis without obstruction: Secondary | ICD-10-CM

## 2015-06-16 NOTE — Progress Notes (Signed)
This is a 50 year old female here today for her post op gallbladder removal done on 06/09/15. Patient states she is doing well. I have reviewed the history of present illness with the patient.  Port sites are clear and healing well. Abdomen is soft and lungs are clear. Pathology consistent with chronic cholecystitis and cholelithiasis Patient to return as needed.     PCP:  No Pcp  This information has been scribed by Ples SpecterJessica Qualls CMA.

## 2015-06-16 NOTE — Patient Instructions (Signed)
Patient to return as needed. 

## 2016-10-08 ENCOUNTER — Encounter: Payer: Self-pay | Admitting: Advanced Practice Midwife

## 2016-10-12 ENCOUNTER — Ambulatory Visit (INDEPENDENT_AMBULATORY_CARE_PROVIDER_SITE_OTHER): Payer: BC Managed Care – PPO | Admitting: Advanced Practice Midwife

## 2016-10-12 ENCOUNTER — Encounter: Payer: Self-pay | Admitting: Advanced Practice Midwife

## 2016-10-12 VITALS — BP 128/88 | HR 85 | Ht 66.0 in | Wt 153.0 lb

## 2016-10-12 DIAGNOSIS — Z0000 Encounter for general adult medical examination without abnormal findings: Secondary | ICD-10-CM | POA: Diagnosis not present

## 2016-10-12 DIAGNOSIS — Z124 Encounter for screening for malignant neoplasm of cervix: Secondary | ICD-10-CM | POA: Diagnosis not present

## 2016-10-12 DIAGNOSIS — Z01419 Encounter for gynecological examination (general) (routine) without abnormal findings: Secondary | ICD-10-CM

## 2016-10-12 NOTE — Progress Notes (Addendum)
Patient ID: Belinda Gill, female   DOB: 08/01/1965, 51 y.o.   MRN: [id number]     Gynecology Annual Exam  PCP: Belinda Gill  Chief Complaint:  Chief Complaint  Patient presents with  . Gynecologic Exam    Pt requesting routine labwork    History of Present Illness:Patient is a 51 y.o. [id number] presents for annual exam. The patient has no complaints today. She has a question about stress related outbreaks of shingles that she has several times Gill year. The rash is always on her right lower back. It is small, mildly painful, and mildly itchy and usually lasts about 4 days. She is wondering if she should have the shingles vaccine. Based on her mild symptoms I suggested she would not need the vaccine but if her symptoms become worse she should consider having the vaccine.   LMP: Patient's last menstrual period was 03/12/2014. Menarche:not applicable Postcoital Bleeding: no Dysmenorrhea: not applicable   The patient is sexually active. She denies dyspareunia.  The patient does perform self breast exams.  There is notable family history of breast or ovarian cancer in her family. Her maternal aunt and great aunt have had breast cancer although she does not know their ages at diagnosis. Her paternal Krieger had ovarian cancer and is deceased.   The patient wears seatbelts: yes.   The patient has regular exercise: yes.  She admits to healthy lifestyle diet and hydration. She admits good support from friends and family.  The patient denies current symptoms of depression.     Review of Systems: Review of Systems  Constitutional: Negative.   HENT: Negative.   Eyes: Negative.   Respiratory: Negative.   Cardiovascular: Negative.   Gastrointestinal: Negative.   Genitourinary: Negative.   Musculoskeletal: Negative.   Skin: Negative.   Neurological: Negative.   Endo/Heme/Allergies: Negative.   Psychiatric/Behavioral: Negative.     Past Medical History:  Past Medical History:    Diagnosis Date  . Anemia    H/O  . Breast cyst, right   . Chronic kidney disease    KIDNEY STONES (LEFT) 06-06-15    Past Surgical History:  Past Surgical History:  Procedure Laterality Date  . BREAST BIOPSY Right 1985   benign  . BREAST MASS EXCISION Left 2004   fatty tumor  . CESAREAN SECTION  1987, 1993, 1997, 2009  . CHOLECYSTECTOMY N/A 06/09/2015   Procedure: LAPAROSCOPIC CHOLECYSTECTOMY WITH INTRAOPERATIVE CHOLANGIOGRAM;  Surgeon: Belinda BrightlySeeplaputhur G Sankar, MD;  Location: [facility] ORS;  Service: General;  Laterality: N/A;  . CRYOTHERAPY    . TUBAL LIGATION      Gynecologic History:  Patient's last menstrual period was 03/12/2014. Last Pap: Results were: normal 2 years ago  Last mammogram: 2 years ago Results were: BI-RAD I Obstetric History: [id number]: [id number]  Family History:  Family History  Problem Relation Age of Onset  . Heart failure Father   . Breast cancer Maternal Aunt   . Breast cancer Maternal Aunt        great aunt  . Breast cancer Maternal Aunt        great aunt    Social History:  Social History   Social History  . Marital status: Married    Spouse name: N/A  . Number of children: N/A  . Years of education: N/A   Occupational History  . Not on file.   Social History Main Topics  . Smoking status: Never Smoker  . Smokeless tobacco: Never Used  . Alcohol use  Yes     Comment: ocassionally  . Drug use: No  . Sexual activity: Yes    Birth control/ protection: Post-menopausal   Other Topics Concern  . Not on file   Social History Narrative  . No narrative on file    Allergies:  No Known Allergies  Medications: Prior to Admission medications   Not on File    Physical Exam Vitals: Blood pressure 128/88, pulse 85, height 5\' 6"  (1.676 m), weight 153 lb (69.4 kg), last menstrual period 03/12/2014.  General: NAD HEENT: normocephalic, anicteric Thyroid: no enlargement, no palpable nodules Pulmonary: No increased work of breathing,  CTAB Cardiovascular: RRR, distal pulses 2+ Breast: Breast symmetrical, no tenderness, no palpable nodules or masses, no skin or nipple retraction present, no nipple discharge.  No axillary or supraclavicular lymphadenopathy. Abdomen: NABS, soft, non-tender, non-distended.  Umbilicus without lesions.  No hepatomegaly, splenomegaly or masses palpable. No evidence of hernia  Genitourinary:  External: Normal external female genitalia.  Normal urethral meatus, normal  Bartholin's and Skene's glands.    Vagina: Normal vaginal mucosa, no evidence of prolapse.    Cervix: Grossly normal in appearance, no bleeding, no CMT  Uterus: Non-enlarged, mobile, normal contour.    Adnexa: ovaries non-enlarged, no adnexal masses  Rectal: deferred  Lymphatic: no evidence of inguinal lymphadenopathy Extremities: no edema, erythema, or tenderness Neurologic: Grossly intact Psychiatric: mood appropriate, affect full    Assessment: 51 y.o. [id number] Well woman exam with PAP smear  Plan: Problem List Items Addressed This Visit    None    Visit Diagnoses    Well woman exam with routine gynecological exam    -  Primary   Relevant Orders   IGP, Aptima HPV   Cervical cancer screening       Relevant Orders   IGP, Aptima HPV   Blood tests for routine general physical examination       Relevant Orders   CBC with Differential/Platelet   Lipid Panel With LDL/HDL Ratio   VITAMIN D 25 Hydroxy (Vit-D Deficiency, Fractures)   Hgb A1c w/o eAG      1) Mammogram - recommend yearly screening mammogram.  Mammogram patient to self schedule at [facility]   2) Continue healthy lifestyle diet and exercise  3) ASCCP guidelines and rational discussed.  Patient opts for every 2-3 years screening interval  4) Osteoporosis  - [place] USPTF routine screening DEXA at age 81   5) Routine healthcare maintenance including cholesterol, diabetes screening discussed Ordered today  6) Colonoscopy.  Screening recommended starting at  age 79 for average risk individuals, age 33 for individuals deemed at increased risk (including African Americans) and recommended to continue until age 64.  For patient age 61-85 individualized approach is recommended.  Gold standard screening is via colonoscopy, Cologuard screening is an acceptable alternative for patient unwilling or unable to undergo colonoscopy.  "Colorectal cancer screening for average?risk adults: 2018 guideline update from the American Cancer Society"[place]: A Cancer Journal for Clinicians: Sep 01, 2016. Patient prefers to wait until next year for screening  7) Follow up 1 year for routine annual  [place], [place]

## 2016-10-13 ENCOUNTER — Other Ambulatory Visit: Payer: Self-pay | Admitting: Advanced Practice Midwife

## 2016-10-13 DIAGNOSIS — Z1231 Encounter for screening mammogram for malignant neoplasm of breast: Secondary | ICD-10-CM

## 2016-10-13 LAB — LIPID PANEL WITH LDL/HDL RATIO
CHOLESTEROL TOTAL: 229 mg/dL — AB (ref 100–199)
HDL: 66 mg/dL (ref >39)
LDL CALC: 150 mg/dL — AB (ref 0–99)
LDL/HDL RATIO: 2.3 ratio (ref 0.0–3.2)
Triglycerides: 63 mg/dL (ref 0–149)
VLDL Cholesterol Cal: 13 mg/dL (ref 5–40)

## 2016-10-13 LAB — CBC WITH DIFFERENTIAL/PLATELET
BASOS ABS: 0 10*3/uL (ref 0.0–0.2)
Basos: 0 %
EOS (ABSOLUTE): 0 10*3/uL (ref 0.0–0.4)
Eos: 0 %
HEMOGLOBIN: 11.9 g/dL (ref 11.1–15.9)
Hematocrit: 35.6 % (ref 34.0–46.6)
Immature Grans (Abs): 0 10*3/uL (ref 0.0–0.1)
Immature Granulocytes: 0 %
LYMPHS ABS: 1.5 10*3/uL (ref 0.7–3.1)
Lymphs: 32 %
MCH: 26.5 pg — AB (ref 26.6–33.0)
MCHC: 33.4 g/dL (ref 31.5–35.7)
MCV: 79 fL (ref 79–97)
MONOCYTES: 5 %
Monocytes Absolute: 0.3 10*3/uL (ref 0.1–0.9)
NEUTROS ABS: 3 10*3/uL (ref 1.4–7.0)
Neutrophils: 63 %
Platelets: 191 10*3/uL (ref 150–379)
RBC: 4.49 x10E6/uL (ref 3.77–5.28)
RDW: 14.9 % (ref 12.3–15.4)
WBC: 4.8 10*3/uL (ref 3.4–10.8)

## 2016-10-13 LAB — VITAMIN D 25 HYDROXY (VIT D DEFICIENCY, FRACTURES): VIT D 25 HYDROXY: 10.5 ng/mL — AB (ref 30.0–100.0)

## 2016-10-13 LAB — HGB A1C W/O EAG: HEMOGLOBIN A1C: 5.3 % (ref 4.8–5.6)

## 2016-10-14 LAB — IGP, APTIMA HPV
HPV Aptima: NEGATIVE
PAP SMEAR COMMENT: 0

## 2016-11-04 ENCOUNTER — Ambulatory Visit
Admission: RE | Admit: 2016-11-04 | Discharge: 2016-11-04 | Disposition: A | Discharge status: Home / Self Care | Payer: BC Managed Care – PPO | Source: Ambulatory Visit | Attending: Advanced Practice Midwife | Admitting: Advanced Practice Midwife

## 2016-11-04 DIAGNOSIS — Z1231 Encounter for screening mammogram for malignant neoplasm of breast: Secondary | ICD-10-CM

## 2017-02-03 IMAGING — CR DG CHOLANGIOGRAM OPERATIVE
3 series · 3 of 3 positions shown · non-contrast
Comparison: None.

CLINICAL DATA: Cholecystectomy for cholelithiasis and chronic
cholecystitis.

EXAM:
INTRAOPERATIVE CHOLANGIOGRAM
TECHNIQUE: Cholangiographic images from the C-arm fluoroscopic device were
submitted for interpretation post-operatively. Please see the
procedural report for the amount of contrast and the fluoroscopy
time utilized.

[s1]
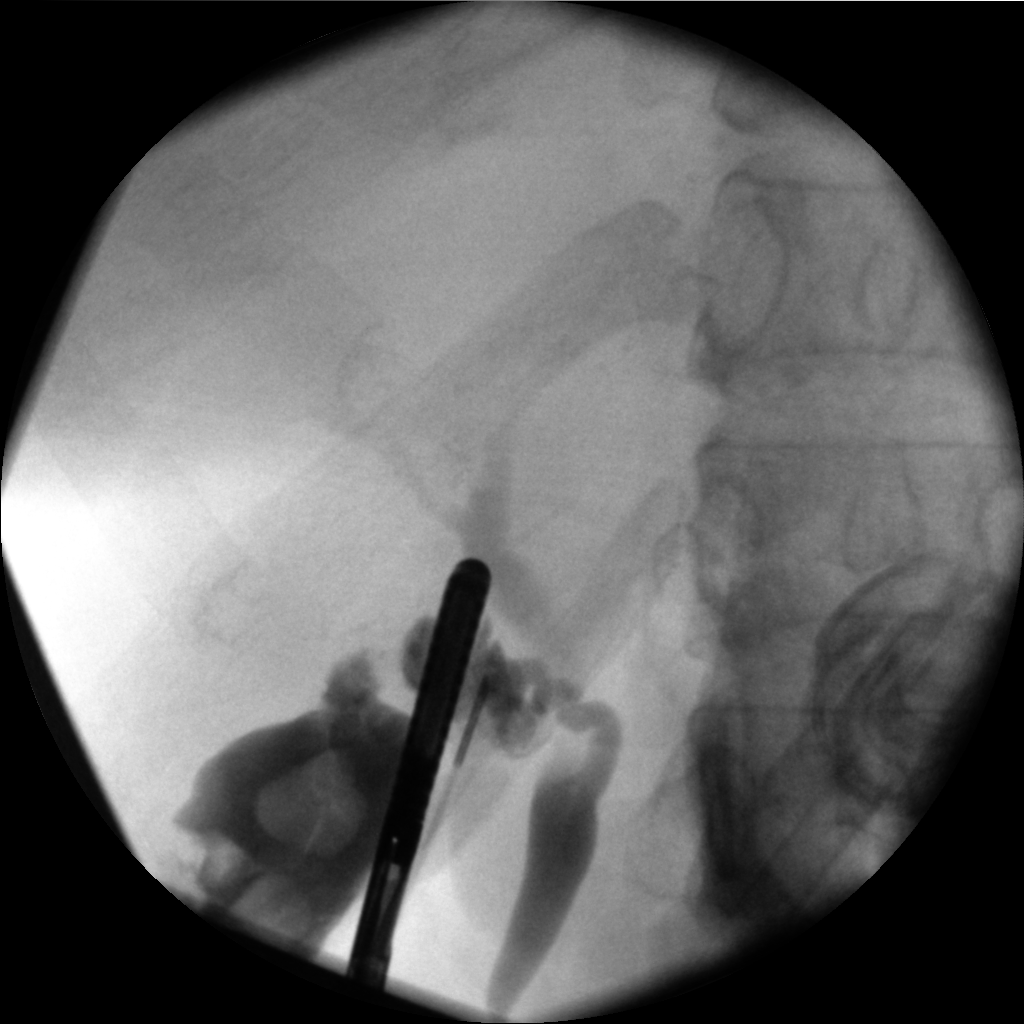

[s2]
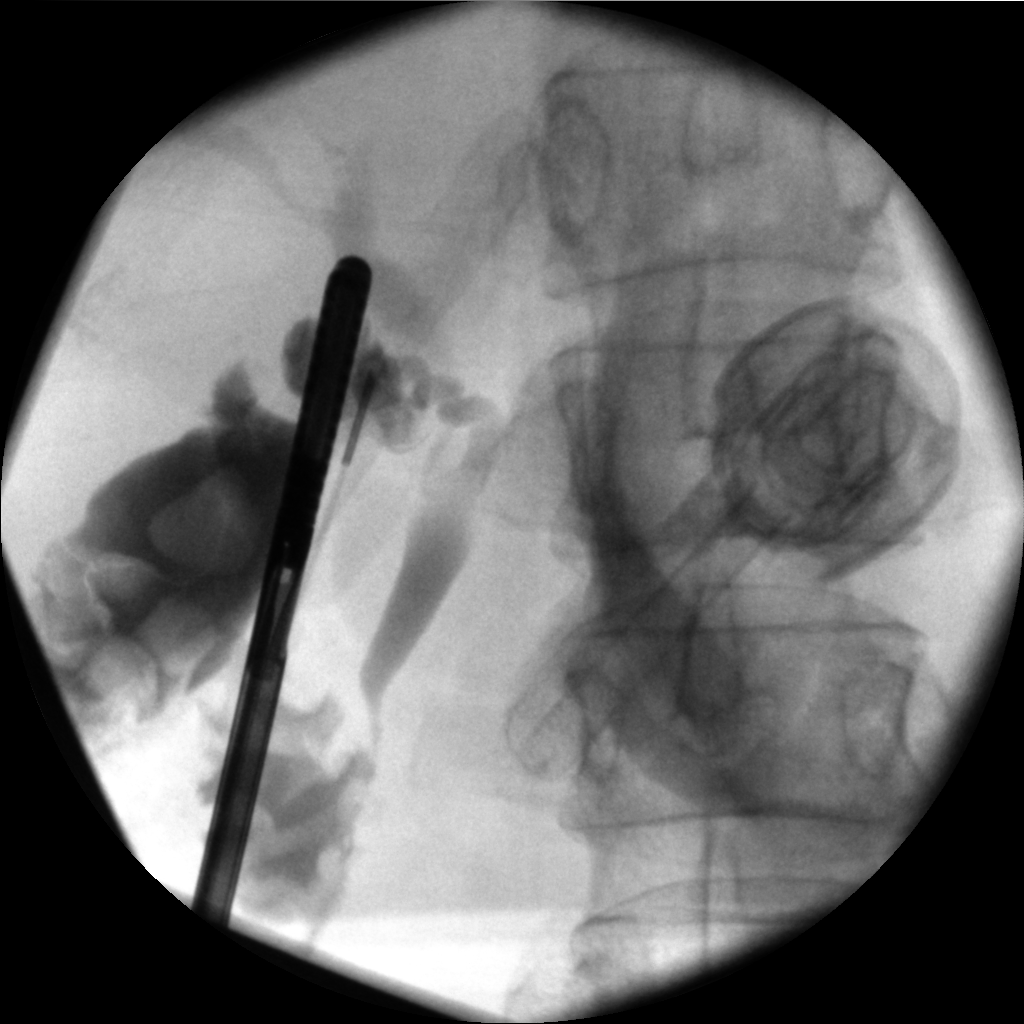

[s3]
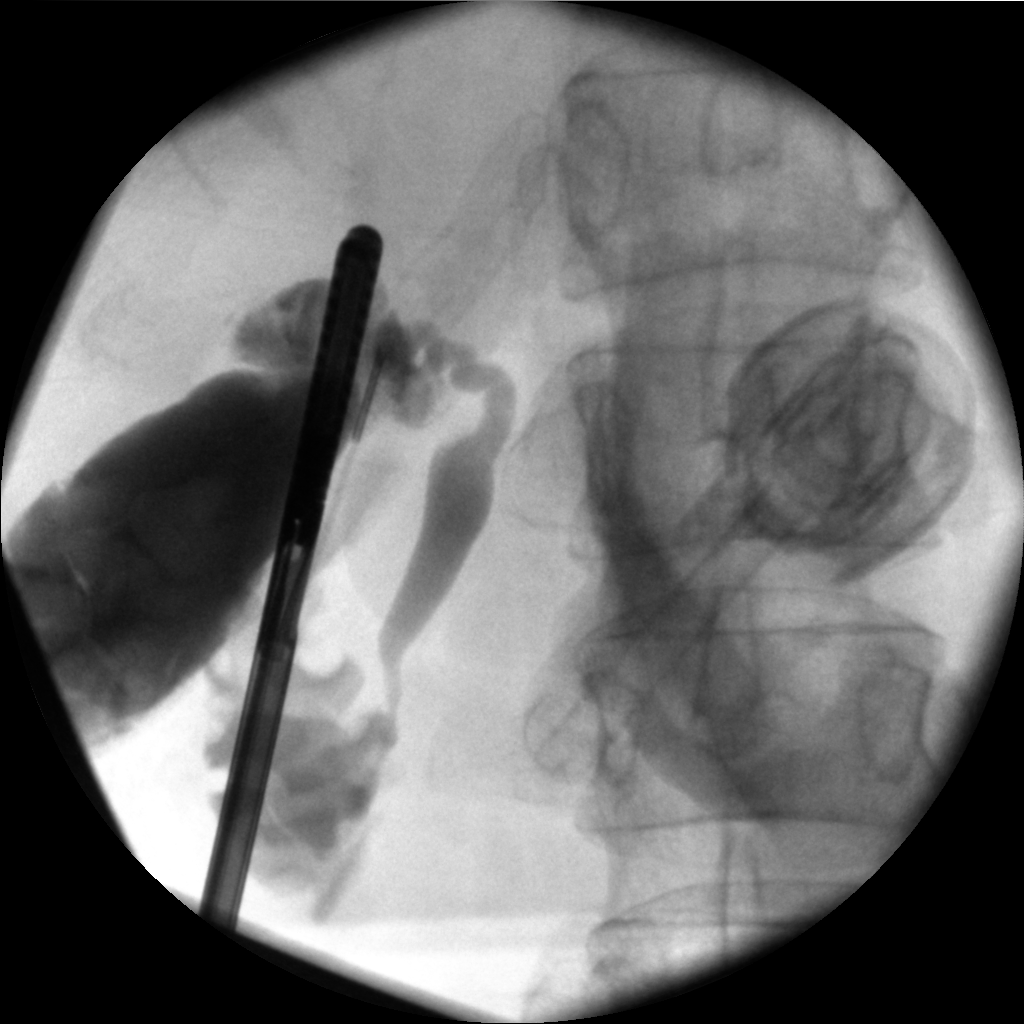

[3 of 3 positions shown; findings below may reference images not displayed]

FINDINGS: Intraoperative images demonstrate normally patent opacified common
bile duct and cystic duct. No filling defects or contrast
extravasation identified. Contrast enters the duodenum. Filling
defects are noted in the gallbladder consistent with calculi.
IMPRESSION: Unremarkable intraoperative cholangiogram.

## 2022-04-08 ENCOUNTER — Other Ambulatory Visit: Payer: Self-pay | Admitting: Chiropractic Medicine

## 2022-04-08 ENCOUNTER — Ambulatory Visit
Admission: RE | Admit: 2022-04-08 | Discharge: 2022-04-08 | Disposition: A | Discharge status: Home / Self Care | Payer: BC Managed Care – PPO | Source: Ambulatory Visit | Attending: Chiropractic Medicine | Admitting: Chiropractic Medicine

## 2022-04-08 DIAGNOSIS — M549 Dorsalgia, unspecified: Secondary | ICD-10-CM

## 2022-04-08 DIAGNOSIS — M542 Cervicalgia: Secondary | ICD-10-CM
